# Patient Record
Sex: Female | Born: 1970 | Race: Black or African American | Hispanic: No | Marital: Married | Smoking: Never smoker
Health system: Southern US, Community
[De-identification: ages and names within clinical notes are randomized; demographics above are authoritative.]

## PROBLEM LIST (undated history)

## (undated) DIAGNOSIS — I10 Essential (primary) hypertension: Secondary | ICD-10-CM

---

## 1998-02-28 ENCOUNTER — Ambulatory Visit (HOSPITAL_COMMUNITY): Admission: RE | Admit: 1998-02-28 | Discharge: 1998-02-28 | Payer: Self-pay | Admitting: Obstetrics & Gynecology

## 1998-06-21 ENCOUNTER — Inpatient Hospital Stay: Admission: AD | Admit: 1998-06-21 | Discharge: 1998-06-21 | Payer: Self-pay | Admitting: Obstetrics & Gynecology

## 1998-06-24 ENCOUNTER — Ambulatory Visit (HOSPITAL_COMMUNITY): Admission: RE | Admit: 1998-06-24 | Discharge: 1998-06-24 | Payer: Self-pay | Admitting: Obstetrics and Gynecology

## 1999-04-30 ENCOUNTER — Other Ambulatory Visit: Admission: RE | Admit: 1999-04-30 | Discharge: 1999-04-30 | Payer: Self-pay | Admitting: Obstetrics and Gynecology

## 1999-11-29 ENCOUNTER — Inpatient Hospital Stay (HOSPITAL_COMMUNITY): Admission: AD | Admit: 1999-11-29 | Discharge: 1999-12-01 | Payer: Self-pay | Admitting: *Deleted

## 2000-01-14 ENCOUNTER — Other Ambulatory Visit: Admission: RE | Admit: 2000-01-14 | Discharge: 2000-01-14 | Payer: Self-pay | Admitting: *Deleted

## 2001-08-25 ENCOUNTER — Other Ambulatory Visit: Admission: RE | Admit: 2001-08-25 | Discharge: 2001-08-25 | Payer: Self-pay | Admitting: Obstetrics and Gynecology

## 2007-07-21 ENCOUNTER — Encounter: Admission: RE | Admit: 2007-07-21 | Discharge: 2007-07-21 | Payer: Self-pay | Admitting: Obstetrics and Gynecology

## 2007-07-27 ENCOUNTER — Encounter: Admission: RE | Admit: 2007-07-27 | Discharge: 2007-07-27 | Payer: Self-pay | Admitting: Obstetrics and Gynecology

## 2007-12-02 ENCOUNTER — Inpatient Hospital Stay (HOSPITAL_COMMUNITY): Admission: AD | Admit: 2007-12-02 | Discharge: 2007-12-13 | Payer: Self-pay | Admitting: Neurosurgery

## 2007-12-21 ENCOUNTER — Ambulatory Visit (HOSPITAL_COMMUNITY): Admission: RE | Admit: 2007-12-21 | Discharge: 2007-12-21 | Payer: Self-pay | Admitting: Neurosurgery

## 2007-12-27 ENCOUNTER — Encounter: Payer: Self-pay | Admitting: Neurosurgery

## 2008-03-19 ENCOUNTER — Ambulatory Visit (HOSPITAL_COMMUNITY): Admission: RE | Admit: 2008-03-19 | Discharge: 2008-03-19 | Payer: Self-pay | Admitting: Interventional Radiology

## 2008-11-30 ENCOUNTER — Ambulatory Visit (HOSPITAL_COMMUNITY): Admission: RE | Admit: 2008-11-30 | Discharge: 2008-11-30 | Payer: Self-pay | Admitting: Interventional Radiology

## 2009-05-23 ENCOUNTER — Encounter: Admission: RE | Admit: 2009-05-23 | Discharge: 2009-05-23 | Payer: Self-pay | Admitting: Family Medicine

## 2009-09-14 IMAGING — MG MM SCREEN MAMMOGRAM BILATERAL
4 series · 4 of 4 positions shown · non-contrast
Comparison: none

DG SCREEN MAMMOGRAM BILATERAL
Bilateral CC and MLO view(s) were taken.

DIGITAL SCREENING MAMMOGRAM WITH CAD:
The breast tissue is extremely dense.  A possible mass is noted in the left breast.  Spot 
compression views and possibly sonography are recommended for further evaluation.  In the right 
breast, no masses or malignant type calcifications are identified.

[R CC]
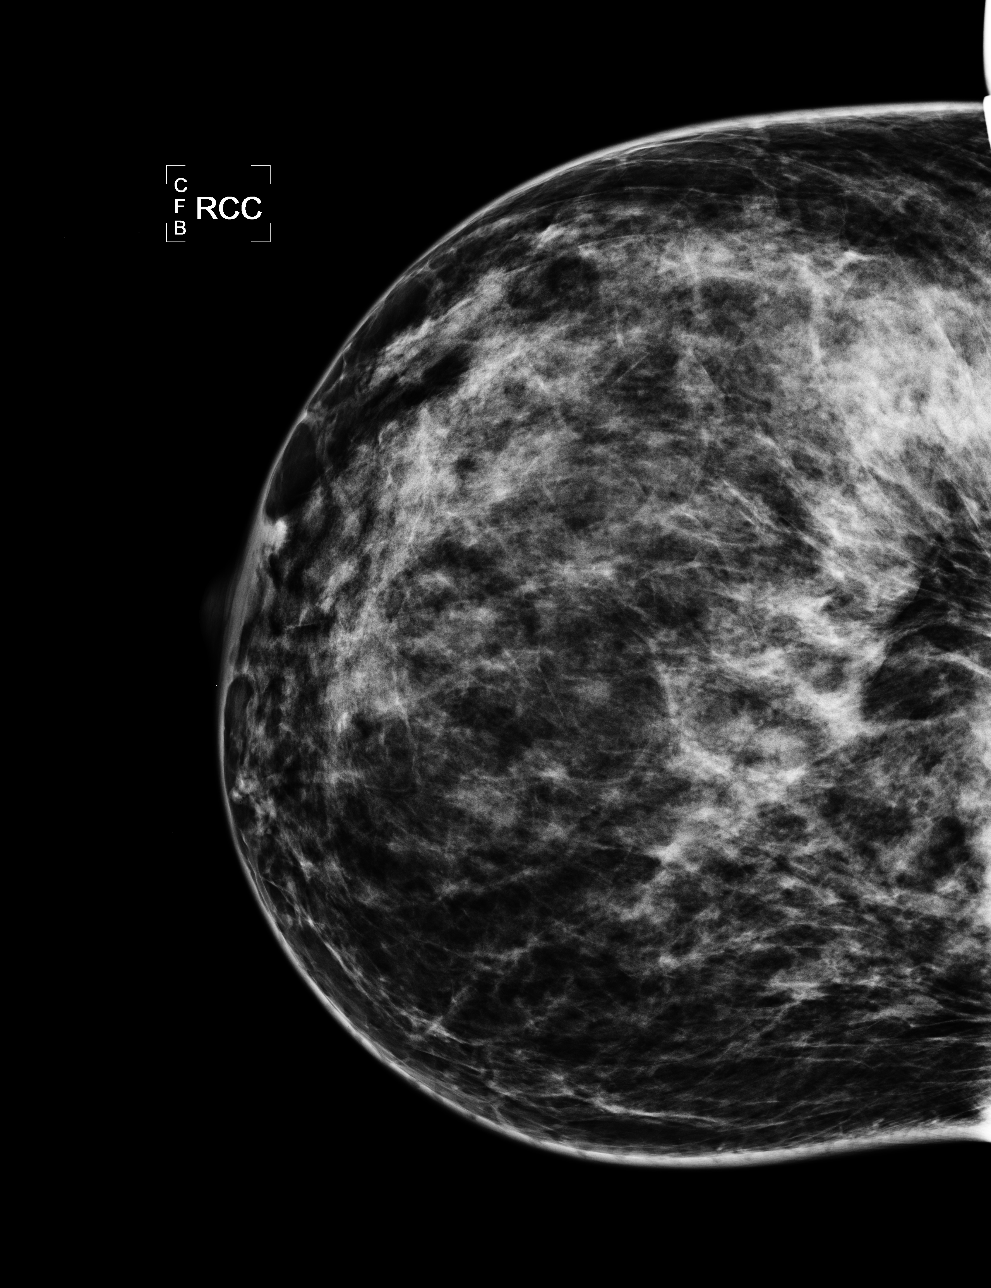

[L CC]
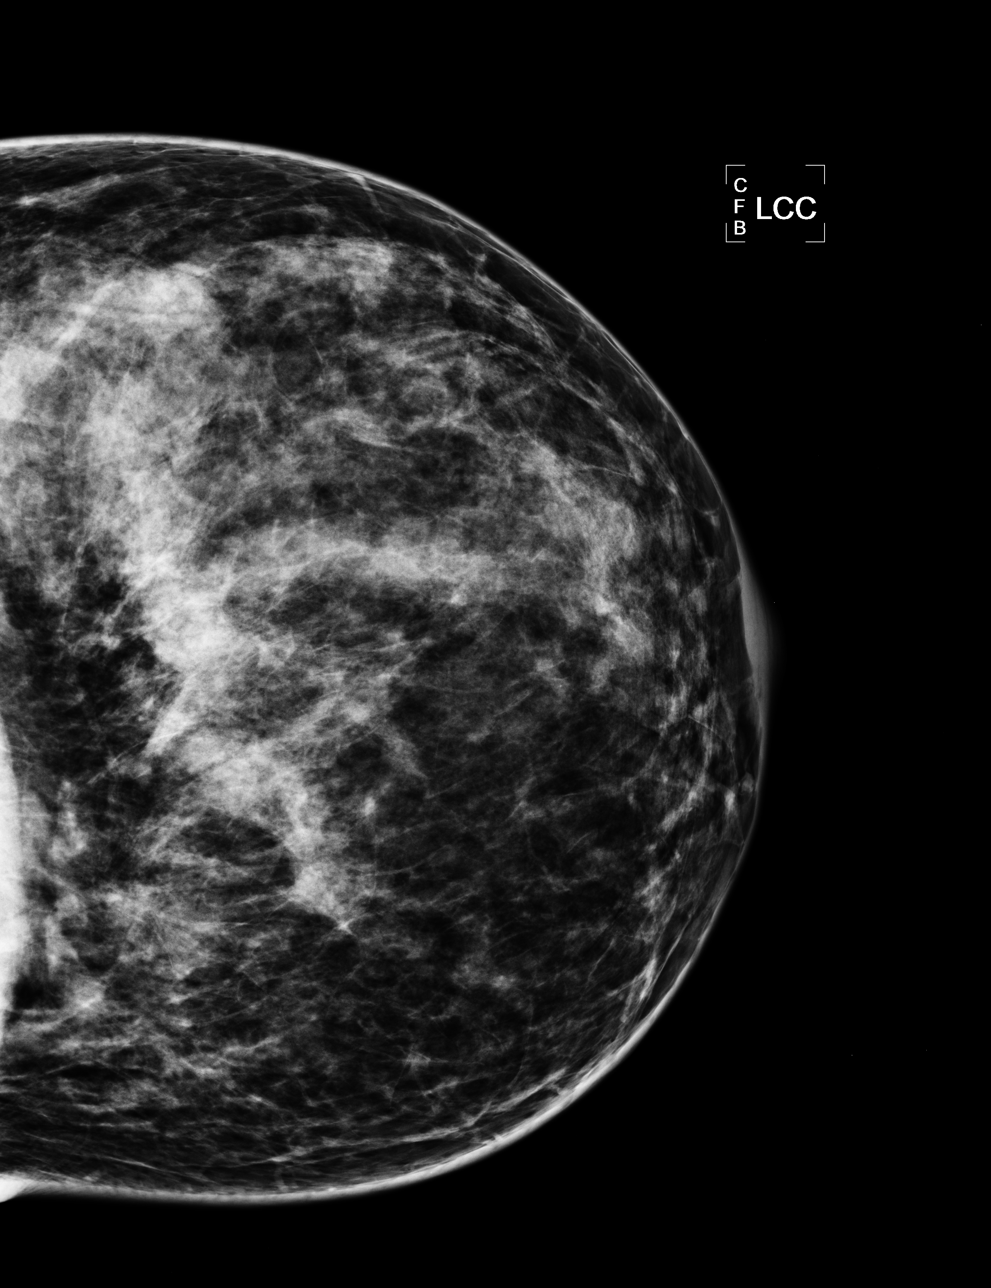

[L MLO]
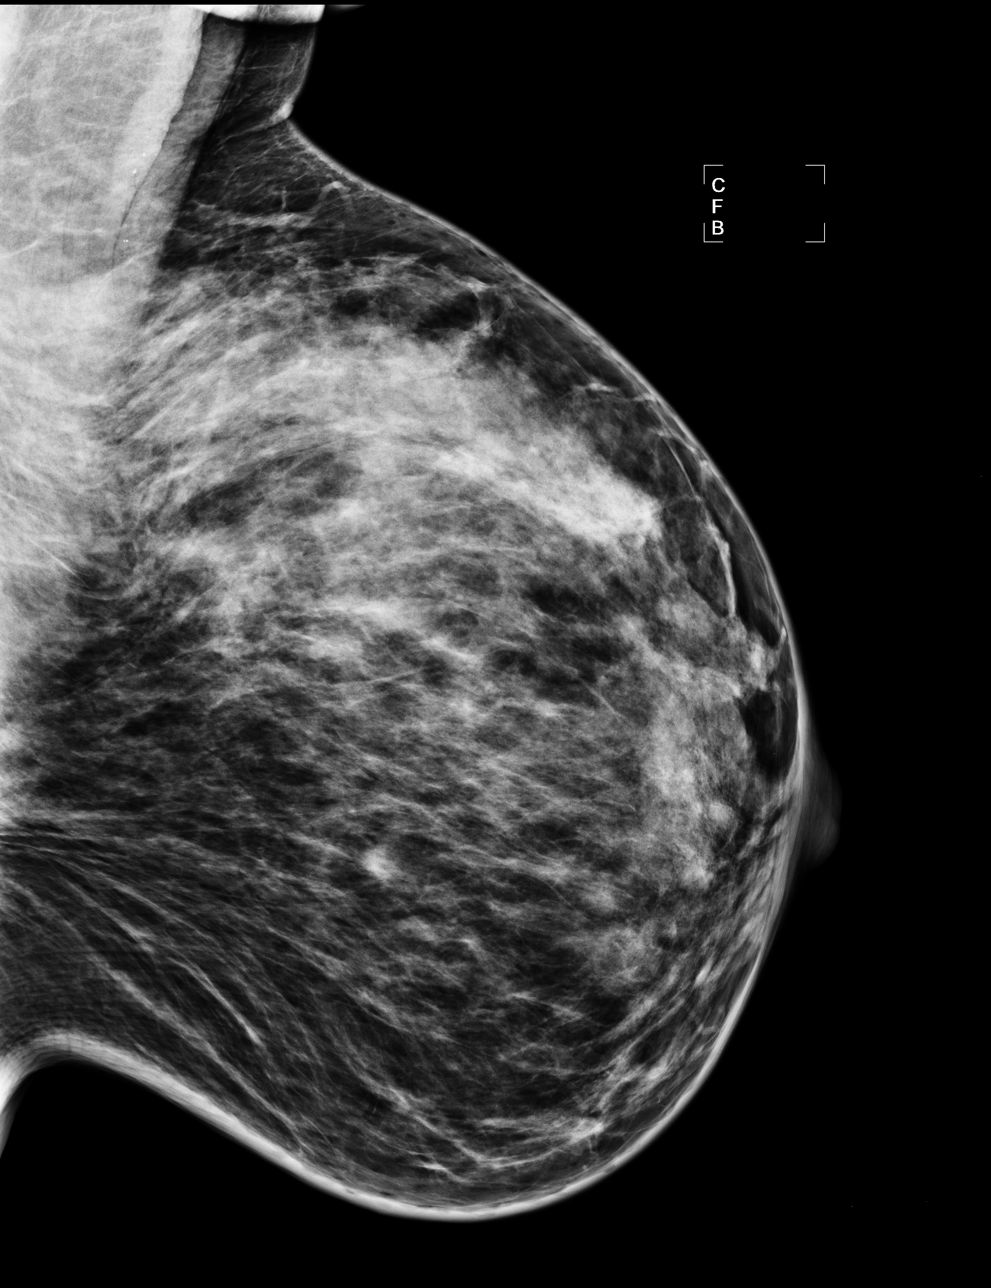

[R MLO]
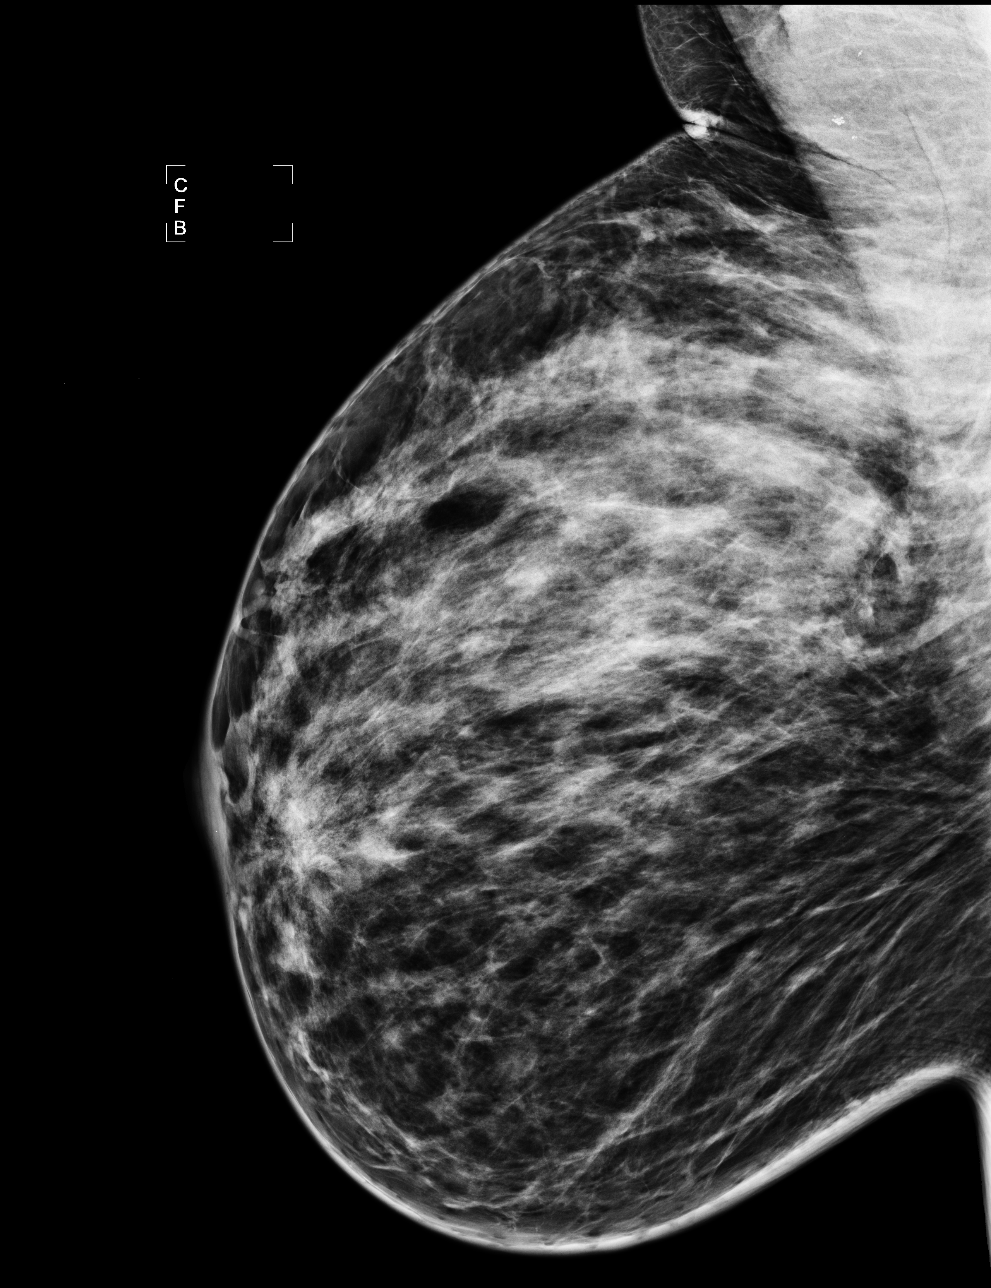

[4 of 4 positions shown; findings below may reference images not displayed]

IMPRESSION: Possible mass, left breast.  Additional evaluation is indicated.  The patient will be contacted for
additional studies and a supplementary report will follow.  No specific mammographic evidence of 
malignancy, right breast.

ASSESSMENT: Need additional imaging evaluation and/or prior mammograms for comparison - BI-RADS 0

Further imaging of the left breast.
ANALYZED BY COMPUTER AIDED DETECTION. , THIS PROCEDURE WAS A DIGITAL MAMMOGRAM.

## 2009-12-23 ENCOUNTER — Ambulatory Visit (HOSPITAL_COMMUNITY): Admission: RE | Admit: 2009-12-23 | Discharge: 2009-12-23 | Payer: Self-pay | Admitting: Interventional Radiology

## 2010-01-26 IMAGING — XA IR ANGIO/CAROTID/CERV BI
2 of 3 series · 11 of 24 positions shown · non-contrast
Comparison: MRI brain and MRA of the brain of 12/01/2004.

12/15/07 – DUPLICATE COPY for exam association in [facility] – No change from original report.
CLINICAL DATA: Severe sudden headaches with nausea. Abnormal MRI
 of brain and MRA of the brain suggestive of left posterior
 communicating artery region aneurysm and vasospasm.

 BILATERAL CAROTID ARTERIOGRAPHY and bilateral vertebral artery
 arteriograms followed by endovascular occlusion of ruptured left
 posterior communicating artery region aneurysm.

[Series 1: run · 10 of 583 slices shown (1 of 2)]
[im 28/583]
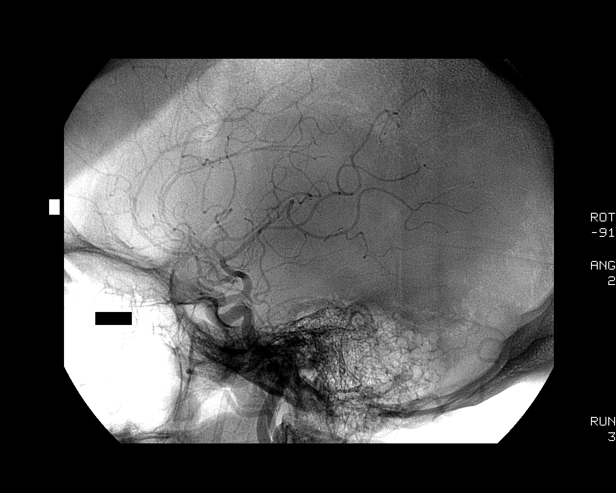
[im 84/583]
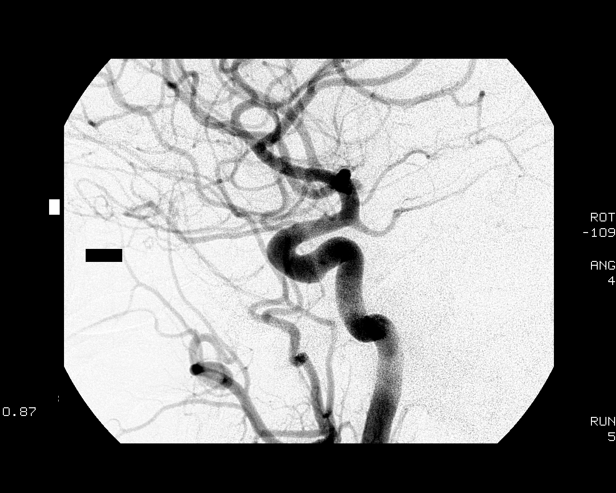
[im 139/583]
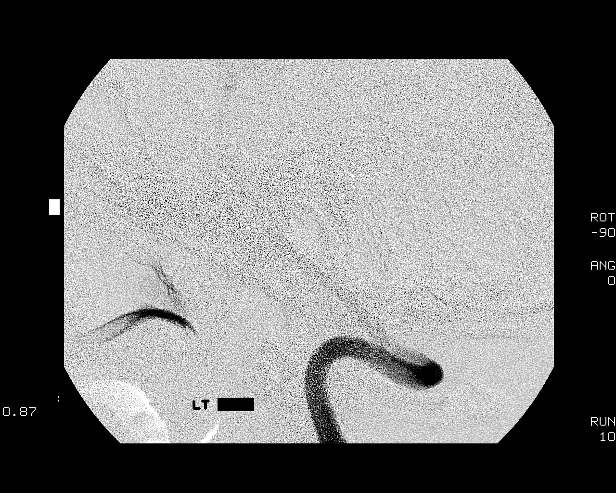
[im 195/583]
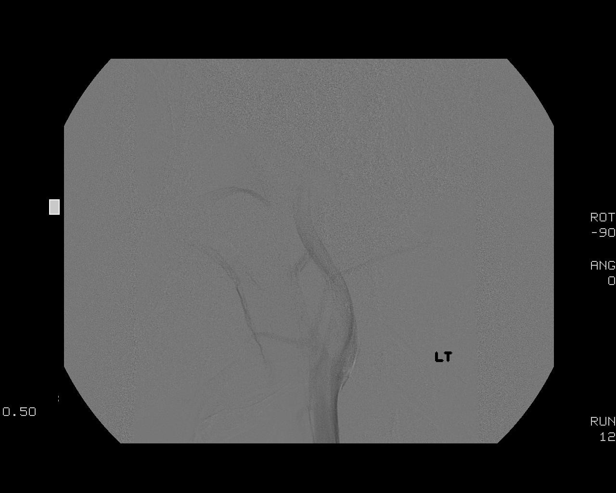
[im 250/583]
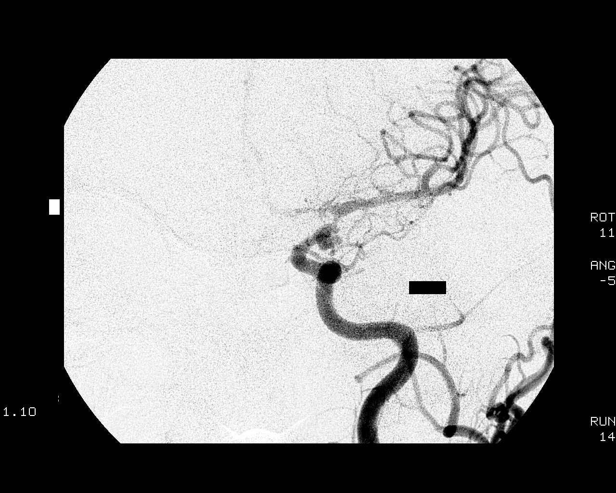
[im 333/583]
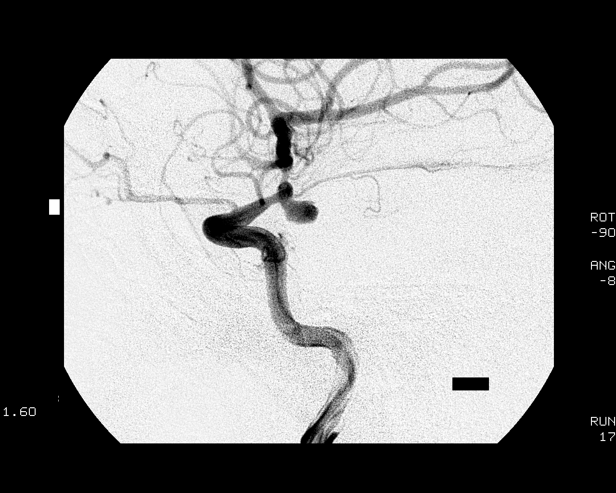
[im 389/583]
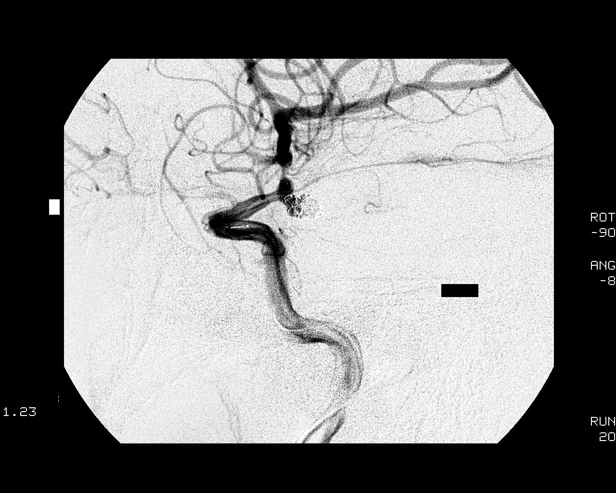
[im 444/583]
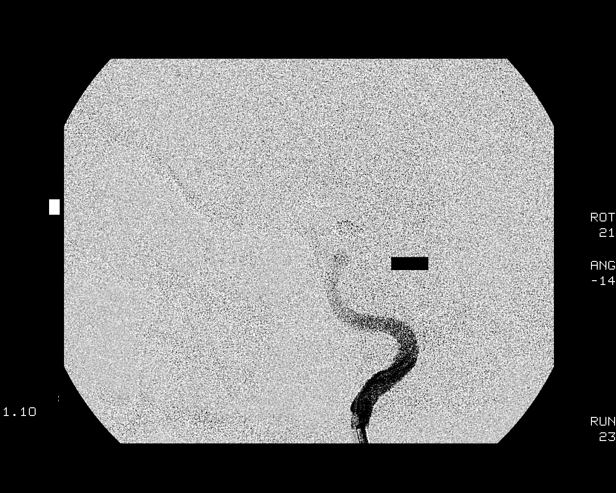
[im 499/583]
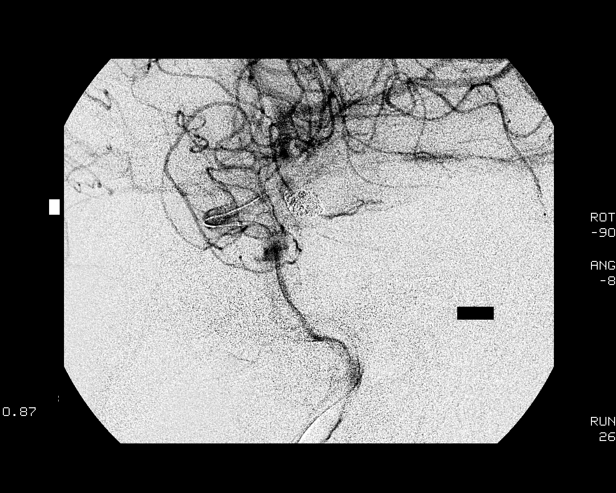
[im 555/583]
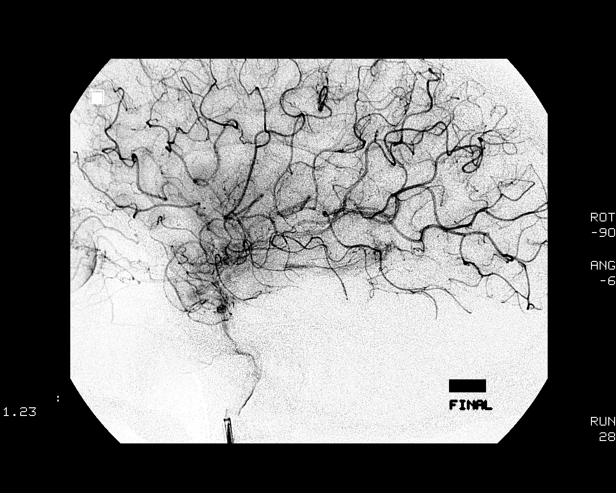

[Series 15: run · 1 of 13 slices shown (2 of 2)]
[im 1/13]
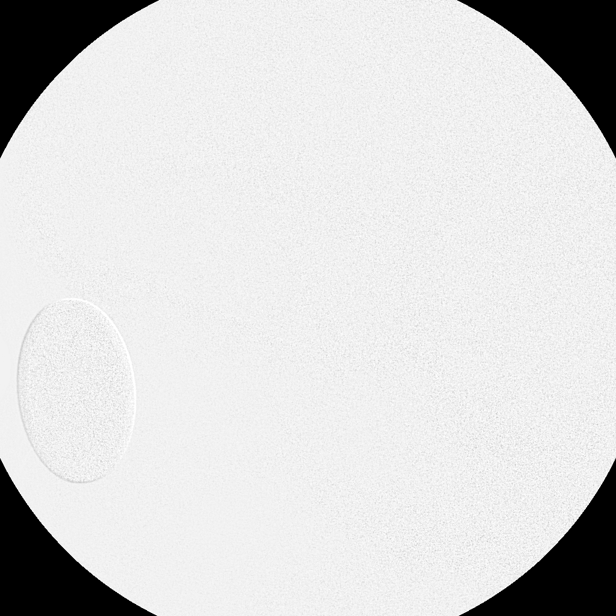

[11 of 24 positions shown; findings below may reference images not displayed]

FINDINGS: Following a full explanation of procedure along with
 potential associated complications, an informed witnessed consent
 was obtained.

 The right groin was prepped and draped in the usual sterile
 fashion. Thereafter using a modified Seldinger technique,
 transfemoral access into the right common femoral artery was
 obtained without difficulty. Over a point of 0.03 inch guide wire,
 5-French Pinnacle sheath was inserted. Through this and also over
 a 0.3 inch guide wire, a 5-French JB1 catheter was advanced through
 the aortic arch region and selectively positioned in the right
 common carotid artery at the right vertebral artery, left common
 carotid artery and left vertebral artery.

 There were no acute complications.

 The patient tolerated the procedure well.

 Medications utilized: Versed 1 mg IV. Fentanyl 25 mcg IV.

 Contrast: Omnipaque 200, approximately 40 ml.

 The right common carotid arteriogram demonstrates the right
 external carotid artery and the major branches to be normally
 opacified

 The right internal carotid artery at the bulb to the cranial skull
 base is also normal. The petrous, the cavernous and the
 supraclinoid segments are normally opacified. Transit
 opacification of the right PCOM is noted.

 The right middle and the right anterior cerebral arteries are seen
 to opacify normally into capillary and venous phases. Cross
 opacification via the ACOM of the left ACA distal to the A2 is
 noted. Also noted is normal opacification of the anterior
 communicating artery region complex from a right internal carotid
 artery injection with the left neck compression. The right
 vertebral artery origin is normal. The vessel is seen to opacify
 normally through the cranial skull base.

 There is normal opacification of the right posterior-inferior
 cerebellar artery and the right vertebrobasilar junction. The
 basilar artery, the posterior cerebral arteries, superior cerebral
 arteries and anterior inferior cerebral arteries are seen to
 opacify normally to capillary and venous phases.

 The left vertebral artery origin is normal. The vessel is seen to
 opacify normally to the cranial skull base. Normal opacification
 is noted of the left posterior -inferior cerebellar artery and the
 left vertebrobasilar junction.

 The basilar artery, the posterior cerebral arteries, superior
 cerebellar arteries and anterior inferior cerebral arteries are
 seen to opacify normally into capillary and venous phases.

 The left common carotid arteriogram demonstrates the left external
 carotid artery and its major branches to be normal.

 The left internal carotid artery at the bulb to the cranial skull
 base is also normally opacified.

 The petrous and the proximal cavernous segments of the left ACA are
 normal

 There is a tapered narrowing of the distal cavernous segment of the
 left ICA extending into the moderately severe stenosis and the
 supraclinoid left ICA. In the left PCOM region, there is saccular
 aneurysm bilobed and irregular in contour measuring approximately
 7.4 mm x 3.5 mm.

 The left middle cerebral artery is seen to opacify normally into
 capillary and venous phases.

 The left anterior cerebral artery in its A1 segment has a focal
 area of moderately severe stenosis also probably representing a
 vasospasm.
IMPRESSION: 1. Approximately 7.4 mm of 3.5 mm saccular aneurysm irregular in
 contour and bilobed arising in the left posterior communicating
 artery region aneurysm.
 2. Moderate to severe to moderate spasm involving the left ICA and
 the supraclinoid segment, and the distal cavernous segment
 respectively.

 ENDOVASCULAR OCCLUSION OF RUPTURED LEFT POSTERIOR COMMUNICATING
 ARTERY REGION ANEURYSM.

 The results of the angiogram were reviewed with Dr. Klaas the
 referring neurosurgeon. It was elected to proceed with
 endovascular treatment of the ruptured left posterior communicating
 artery region aneurysm. The risks of the procedure were discussed
 with the patient and the spouse. Risk of intra-procedure rupture,
 need for emergent neurosurgical intervention, thromboembolic stroke
 and death were reviewed.

 The patient was put under general anesthesia by The [REDACTED] following informed consent. The diagnostic JB1
 catheter in the left common carotid artery was exchanged over a
 0.035 300 cm Rosen exchange guide wire for a 6-French 65 cm
 neurovascular sheath using biplane roadmap technique and constant
 fluoroscopic guidance. Good aspiration was obtained from the hub
 of this neurovascular sheath. A gentle contrast ejection
 demonstrated no evidence of spasms, dissections or intraluminal
 filling defects

 This was then connected to continuous Heparinized-saline fusion.
 Over the Rosen exchange guide wire, a 6-French 90 cm Brite-Tip
 Envoy straight guide catheter was then advanced and positioned just
 proximal to the left common carotid artery bifurcation. The guide
 wire was removed. Good aspiration was obtained from the hub of the
 6-French guide catheter. A gentle contrast injection demonstrated
 no evidence of spasm, dissections or internal filling defects.
 Over a 0.035 inch Roadrunner guide wire, using biplane roadmap
 technique and constant fluoroscopic guidance, a 6-French 90 cm
 guide catheter was then advanced into the distal left internal
 carotid artery. The guide wire was again removed. Good aspiration
 was obtained from the hub of the 6-French guide catheter. Again, a
 gentle contrast ejection demonstrated no evidence of spasms,
 dissections or intraluminal filling defects.

 At this time, in a coaxial manner and with constant Heparinized
 saline infusion, an Excelsior TF-S1 microcatheter with two markers
 which had been steam shaved was advanced over a 0.014 inch
 Transcend soft-EX microguidewire to the distal end of the catheter.
 With the microguidewire leading with a J-tip configuration to avoid
 dissections or inducing spasm, the combination was navigated with
 torque device control into the distal cavernous segment. With the
 microguidewire leading the J-tip configuration, the aneurysm was
 entered without difficulty followed by the microcatheter. The
 microguidewire was then retrieved using constant fluoroscopic
 guidance and roadmap technique ensuring no sudden forward motion
 movements of the microcatheter tip. None was observed. Good
 aspiration was obtained from the hub of the microcatheter. A
 controlled arteriogram was performed through the 6-French guide
 catheter demonstrating safe positioning of the microcatheter with
 an aneurysm for embolization to begin.

 The first coil utilized was a 4 mm x 11.5 cm Presidio 10 Cerecyte
 coil. This was advanced using biplane roadmap technique and
 constant fluoroscopic guidance in a coaxial manner with constant
 Heparinized saline infusion to the distal end of the microcatheter
 within the aneurysm. The aneurysm was then entered with the coil
 within a nice basket being formed. A control arteriogram through
 the 6-French guide catheter at this time demonstrated flash
 extravasation from the fundus of the aneurysm projecting
 posteriorly. No hemodynamic changes such as sudden arising blood
 pressure or change in heart rate was observed. The patient's
 neurological status was also stable. This coil was then detached.
 This then followed by the placement of a 2 mm x 4 cm HydroCoil 10,
 a 2 mm x 3 cm Hydrasoft HydroCoil 10, a 2 mm x 3 cm Hydrasoft
 HydroCoil 10, a 2 mm x 3 cm Hydrocoil 10 subsequently by a 2 mm x
 2.5 cm MicruSphere Cerecyte coil, and finally a 2 mm x 2 cm height
 HydroCoil 10. After the placement of the second coil, no
 extravasation was noted.

 Each coil was advanced into the aneurysm using biplane roadmap
 technique and constant fluoroscopic guidance. Prior to detachment,
 a gentle contrast injection via the 6-French guide catheter was
 made to ensure safe positioning of the core mass. At the end of
 the final coil, there is near complete obliteration of the aneurysm
 with no change in the intracranial circulation. There was a
 minimal 1 mm neck remnant noted with stasis and slow flow.

 Using biplane roadmap technique and constant fluoroscopic guidance,
 the microcatheter was retrieved from the coil mass ensuring no
 movement of the coil mass at the neck. None was observed. This
 was then retrieved proximally. Control arteriogram was then
 performed through the 6-French guide catheter which continues to
 demonstrate near-complete obliteration with no change in the neck
 remnant or stability of a coil mass. Contrast stasis was noted in
 the region of the neck of the aneurysm.

 The microcatheter including the 6-French guide catheter and 6-
 French neurovascular sheath were then retrieved and removed into
 the abdominal aorta and exchanged over J-tip guide wire for a 7-
 French neurovascular sheath. This was then connected to continuous
 Heparinized saline infusion.

 The patient was then transferred to the CT scan suite for post-
 procedural CT scan of the brain.
IMPRESSION: 1. Status post endovascular obliteration of ruptured left PCOM
 region aneurysm with less than 1 mm neck remnant as described.

## 2010-10-14 LAB — CBC
HCT: 35 % — ABNORMAL LOW (ref 36.0–46.0)
Hemoglobin: 12.1 g/dL (ref 12.0–15.0)
WBC: 3 10*3/uL — ABNORMAL LOW (ref 4.0–10.5)

## 2010-10-14 LAB — BASIC METABOLIC PANEL
Calcium: 8.4 mg/dL (ref 8.4–10.5)
GFR calc Af Amer: >60 mL/min (ref >60)
GFR calc non Af Amer: >60 mL/min (ref >60)
Potassium: 3.7 mEq/L (ref 3.5–5.1)
Sodium: 136 mEq/L (ref 135–145)

## 2010-10-14 LAB — PROTIME-INR
INR: 1.1 (ref 0.00–1.49)
Prothrombin Time: 14 seconds (ref 11.6–15.2)

## 2010-10-14 LAB — APTT: aPTT: 31 seconds (ref 24–37)

## 2010-11-18 NOTE — Consult Note (Signed)
Lisa Jordan, Lisa Jordan             ACCOUNT NO.:  [id number]   MEDICAL RECORD NO.:  [id number]          PATIENT TYPE:  OUT   LOCATION:  XRAY                         FACILITY:  [facility]   PHYSICIAN:  Sanjeev K. Deveshwar, M.D.DATE OF BIRTH:  June 10, 1971   DATE OF CONSULTATION:  12/27/2007  DATE OF DISCHARGE:                                 CONSULTATION   CHIEF COMPLAINT:  Cerebral aneurysm.   HISTORY OF PRESENT ILLNESS:  This is a very pleasant 40 year old female  who was admitted in [facility] on Dec 02, 2007, by Dr. 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  The patient was taken to the interventional lab  by Dr. Corliss Skains shortly after admission and underwent coiling of a 7.4  mm x 3.5 mm left posterior communicating artery aneurysm.  This was  followed by prolonged hospital stay in the Neuro Intensive Care Unit.  Overall, the patient did very well considering the circumstances.  She  returns today accompanied by her husband, to be seen in followup.   The patient recently saw Dr. Wynetta Emery last Tuesday.  She had been  experiencing some headaches since discharge.  Transcranial Doppler's  were performed, which according to the patient's husband Dr. Wynetta Emery,  interpreted as having some limited, but ongoing vasospasm.  The  patient's headaches appear to be improving at this time, although she  has suffered from some mild fatigue.  She is not due to return to work  until August.  She has been driving some short distances with Dr. Lonie Peak  permission.  She presents today accompanied by her husband, to be seen  in followup by Dr. Corliss Skains.   PAST MEDICAL HISTORY:  The patient has been very healthy.  She has had  some mild anemia.  She was seen in consultation while in the hospital  for some heartburn and indigestion and treated with Protonix, this has  since cleared up.   SURGICAL HISTORY:  The patient has had no major  surgeries.   ALLERGIES:  1. VICODIN causes her throat to itch and chest pain.  2. She is intolerant to tramadol, which caused her to become anxious.   CURRENT MEDICATIONS:  Include Darvocet p.r.n. for headaches.   SOCIAL HISTORY:  The patient is married.  They have 2 children.  They  live in [place].  The patient has never been a smoker.  She does not  use alcohol to any significant degree.  She works as an Programmer, systems.   FAMILY HISTORY:  Her mother is alive and well at age 72.  Her father is  alive at age 47.  She has problems with pancreatitis.  The patient has a  strong history of cerebral aneurysms in her family including her  maternal great-grandmother, her grandmother's brother, and his daughter  as well as an uncle; all of whom has had cerebral aneurysms.   IMPRESSION AND PLAN:  As noted, the patient returns today, to be seen in  followup by Dr. Corliss Skains after undergoing coiling of a ruptured left  posterior communicating artery region aneurysm measuring 7.4 mm  x 3.5 mm  performed by Dr. Corliss Skains, on Dec 02, 2007.  Apparently, the patient  had been having problems with headaches for quite some time prior to the  leaking of the aneurysm, which led to her admission on Dec 02, 2007.  Dr. Corliss Skains believes that this was the second time that the aneurysm  may have leaked.   Dr. Corliss Skains did review the results from the angiogram with the patient  and her husband.  They were shown the before and after images of the  aneurysm and the coiling.  They had multiple questions and talked at  great length.  They also had some questions regarding their children,  whether or not, they should be screened for aneurysms.  Dr. Corliss Skains  did not feel that this was appropriate at their age and in view of the  fact that they were asymptomatic.   The plan at this time will be to repeat a cerebral angiogram in  approximately 3 months and then 1 year from the initial date of the  coiling.   Further recommendations will be made based on the results of  these subsequent angiograms.   Greater than 40 minutes was spent on this consult.      Delton See, P.A.    ______________________________  Grandville Silos. Corliss Skains, M.D.    DR/MEDQ  D:  12/27/2007  T:  12/28/2007  Job:  [id number]   cc:   Donalee Citrin, M.D.  Emeterio Reeve, MD

## 2010-11-18 NOTE — Consult Note (Signed)
NAMEMARG, MACMASTER NO.:  [id number]   MEDICAL RECORD NO.:  [id number]          PATIENT TYPE:  INP   LOCATION:  3114                         FACILITY:  [facility]   PHYSICIAN:  Graylin Shiver, M.D.   DATE OF BIRTH:  1970/08/07   DATE OF CONSULTATION:  DATE OF DISCHARGE:                                 CONSULTATION   REASON FOR CONSULTATION:  The patient is a 40 year old black female who  was admitted to the hospital last week with a severe headache.  She was  found to have a brain aneurysm with subsequent therapeutic intervention.  With coiling, there was some surrounding blood in the brain aneurysm  from my review of the records.  The patient is doing well from that  standpoint and progressing nicely.   The reason that we were asked to consult on her from a GI standpoint is  because yesterday she developed indigestion and heartburn.  The patient  feels a little better today.  She is on Protonix 40 mg p.o. b.i.d.  She  states that yesterday while eating, she started getting quite severe  heartburn and indigestion.  She does not give a history of peptic ulcer  disease or GERD.  She denies dysphagia.  She denies vomiting,  hematemesis, melena, or hematochezia.   PAST HISTORY:  Allergies HYDROCODONE.   PAST SURGICAL HISTORY:  None.   MEDICAL PROBLEMS:  None.   SOCIAL HISTORY:  Does not smoke or drink alcohol.   PHYSICAL EXAMINATION:  She is in no distress.  She is alert and  oriented.  She is nonicteric.  Heart regular rhythm.  No murmurs.  Lungs  are clear.  Abdomen is soft, nontender, no hepatosplenomegaly.   Review of labs show her stool was heme positive; however, she states she  is on her menstrual period.  She has not seen any blood in the stool or  melena.   Hemoglobin is 9.2.   IMPRESSION:  1. New onset of indigestion and heartburn.  This could be secondary to      stress gastritis or acute reflux.  2. Anemia.  3. Heme-positive stool; However, the  patient is on her menstrual      period.   RECOMMENDATIONS:  I would recommend that we treat this patient  conservatively at this time with Protonix 40 mg p.o. b.i.d. and observe  her symptoms.  She does not appear to be suffering much at this time  from the indigestion  or heartburn.  Hopefully, we can control her symptoms with double-dose  Protonix for now and then switch to once a day.  In regards to the  anemia, I will check serum iron studies, B12 and a folate level as far  as the heme-positive stool.  This may be a false positive in light of  being on her menstrual period.           ______________________________  Graylin Shiver, M.D.     SFG/MEDQ  D:  12/10/2007  T:  12/10/2007  Job:  [id number]   cc:   Donalee Citrin, M.D.

## 2010-11-21 NOTE — Discharge Summary (Signed)
Lisa Jordan, Lisa Jordan             ACCOUNT NO.:  [id number]   MEDICAL RECORD NO.:  [id number]          PATIENT TYPE:  INP   LOCATION:  3114                         FACILITY:  [facility]   PHYSICIAN:  Donalee Citrin, M.D.        DATE OF BIRTH:  May 10, 1971   DATE OF ADMISSION:  12/02/2007  DATE OF DISCHARGE:  12/13/2007                               DISCHARGE SUMMARY   ADMITTING DIAGNOSES:  1. Subarachnoid hemorrhage.  2. Ruptured posterior communicating artery aneurysm.  3. Vasospasm.   DISCHARGE DIAGNOSES:  1. Subarachnoid hemorrhage.  2. Ruptured posterior communicating artery aneurysm.  3. Vasospasm.   PROCEDURE DURING THIS HOSPITALIZATION:  Coiling of a posterior  communicating artery aneurysm.   HISTORY OF PRESENT ILLNESS:  The patient is a 40 year old female who  presented with a month history of intermittent headaches, two sentinel  headaches, and the most recent headache was 9 days ago.  Admitting MRI  scan and MRA scan showed PCOM aneurysm.  Subsequent arteriogram showed  severe vasospasm and a PCOM aneurysm.  The patient underwent coiling and  postoperatively the patient went to an ICU.  In the ICU, the patient  recovered well, had some episodic blurred vision that resolved.  She did  have a postop CT that showed a slight increase in her ventricle size and  some contrast material with some extravasation that she had during the  coiling procedure.  She had a partial right fourth nerve palsy.  This  did resolve in the next 24 hours.  The patient is now awoken and was  observed in the ICU, maintained on hypervolemic and hemodilution therapy  to treat her vasospasm, was not ever started on pressors which was not  needed.  The patient was followed with serial transcranial Dopplers  which did show worsening of her vasospasm before it improved, however,  this did remain stable and clinically she remained intact except for  headache.  She did had some nausea and vomiting and some  presumptive  gastritis symptoms.  The patient was seen by Gastroenterology.  Some  medication changes were made to control her gastritis and this did help.  She subsequently resolved from this and was ambulating and voiding with  minimal headaches.  Subsequent CT showed resolution of ventricular size  with no hydrocephalus.  The patient was able to be discharged home after  transcranial Dopplers shown to be improving, and she was discharged with  followup in 1 week for a repeat transcranial Doppler and evaluation and  she was discharged on a couple days of nimodipine as well as pain  medication and at the time of discharge, she was neurologically intact.           ______________________________  Donalee Citrin, M.D.     GC/MEDQ  D:  01/18/2008  T:  01/18/2008  Job:  [id number]

## 2011-01-20 ENCOUNTER — Other Ambulatory Visit (HOSPITAL_COMMUNITY)
Admission: RE | Admit: 2011-01-20 | Discharge: 2011-01-20 | Disposition: A | Discharge status: Home / Self Care | Payer: BC Managed Care – PPO | Source: Ambulatory Visit | Attending: Obstetrics and Gynecology | Admitting: Obstetrics and Gynecology

## 2011-01-20 ENCOUNTER — Other Ambulatory Visit: Payer: Self-pay | Admitting: Obstetrics and Gynecology

## 2011-01-20 DIAGNOSIS — Z1231 Encounter for screening mammogram for malignant neoplasm of breast: Secondary | ICD-10-CM

## 2011-01-20 DIAGNOSIS — Z01419 Encounter for gynecological examination (general) (routine) without abnormal findings: Secondary | ICD-10-CM | POA: Insufficient documentation

## 2011-01-20 DIAGNOSIS — Z1159 Encounter for screening for other viral diseases: Secondary | ICD-10-CM | POA: Insufficient documentation

## 2011-02-10 ENCOUNTER — Ambulatory Visit
Admission: RE | Admit: 2011-02-10 | Discharge: 2011-02-10 | Disposition: A | Discharge status: Home / Self Care | Payer: BC Managed Care – PPO | Source: Ambulatory Visit | Attending: Obstetrics and Gynecology | Admitting: Obstetrics and Gynecology

## 2011-02-10 DIAGNOSIS — Z1231 Encounter for screening mammogram for malignant neoplasm of breast: Secondary | ICD-10-CM

## 2011-04-01 LAB — BASIC METABOLIC PANEL
BUN: 2 — ABNORMAL LOW
BUN: 9
Calcium: 9.3
Chloride: 110
GFR calc Af Amer: >60
GFR calc non Af Amer: >60
GFR calc non Af Amer: >60
Glucose, Bld: 98
Potassium: 3.4 — ABNORMAL LOW
Potassium: 3.8
Sodium: 138
Sodium: 140

## 2011-04-01 LAB — TYPE AND SCREEN
ABO/RH(D): A POS
Antibody Screen: NEGATIVE

## 2011-04-01 LAB — APTT: aPTT: 29

## 2011-04-01 LAB — CBC
HCT: 34.4 — ABNORMAL LOW
Hemoglobin: 11.8 — ABNORMAL LOW
MCHC: 35.3
MCV: 97.6
Platelets: 219
Platelets: 284
RBC: 3.17 — ABNORMAL LOW
RDW: 11.8
WBC: 7.5

## 2011-04-01 LAB — POCT I-STAT 7, (LYTES, BLD GAS, ICA,H+H)
Acid-Base Excess: 2
Bicarbonate: 25.5 — ABNORMAL HIGH
HCT: 30 — ABNORMAL LOW
O2 Saturation: 100
Sodium: 142
TCO2: 26
pO2, Arterial: 552 — ABNORMAL HIGH

## 2011-04-01 LAB — DIFFERENTIAL
Basophils Absolute: 0
Eosinophils Relative: 0
Lymphocytes Relative: 18
Lymphs Abs: 1.4
Neutro Abs: 5.8

## 2011-04-01 LAB — POCT I-STAT GLUCOSE
Glucose, Bld: 92
Operator id: 117072

## 2011-04-01 LAB — PROTIME-INR
INR: 1
Prothrombin Time: 12.9

## 2011-04-01 LAB — HEPARIN LEVEL (UNFRACTIONATED): Heparin Unfractionated: <0.1 — ABNORMAL LOW

## 2011-04-02 LAB — CBC
HCT: 25.8 — ABNORMAL LOW
HCT: 26.3 — ABNORMAL LOW
HCT: 33.3 — ABNORMAL LOW
Hemoglobin: 11.7 — ABNORMAL LOW
Hemoglobin: 11.8 — ABNORMAL LOW
Hemoglobin: 9.2 — ABNORMAL LOW
Hemoglobin: 9.2 — ABNORMAL LOW
MCHC: 34
MCHC: 35.1
MCV: 97.6
MCV: 99.3
Platelets: 200
Platelets: 217
Platelets: 307
Platelets: 307
RBC: 2.64 — ABNORMAL LOW
RBC: 2.79 — ABNORMAL LOW
RDW: 12.4
RDW: 12.6
RDW: 12.8
WBC: 6.1
WBC: 6.7
WBC: 9

## 2011-04-02 LAB — URINE MICROSCOPIC-ADD ON

## 2011-04-02 LAB — BASIC METABOLIC PANEL
BUN: 5 — ABNORMAL LOW
BUN: 8
BUN: 9
CO2: 22
CO2: 24
Calcium: 8.2 — ABNORMAL LOW
Calcium: 8.3 — ABNORMAL LOW
Calcium: 8.4
Chloride: 105
Chloride: 112
Creatinine, Ser: 0.52
Creatinine, Ser: 0.62
Creatinine, Ser: 0.63
GFR calc Af Amer: >60
GFR calc Af Amer: >60
GFR calc non Af Amer: >60
GFR calc non Af Amer: >60
GFR calc non Af Amer: >60
Glucose, Bld: 136 — ABNORMAL HIGH
Glucose, Bld: 98
Potassium: 3.7
Potassium: 4.1
Sodium: 139
Sodium: 139

## 2011-04-02 LAB — DIFFERENTIAL
Basophils Absolute: 0
Basophils Relative: 1
Lymphocytes Relative: 18
Monocytes Absolute: 0.4
Neutro Abs: 3.9

## 2011-04-02 LAB — URINE CULTURE: Colony Count: >=100000

## 2011-04-02 LAB — FOLATE RBC: RBC Folate: 548

## 2011-04-02 LAB — URINALYSIS, ROUTINE W REFLEX MICROSCOPIC
Bilirubin Urine: NEGATIVE
Glucose, UA: NEGATIVE
Ketones, ur: NEGATIVE
Protein, ur: NEGATIVE

## 2011-04-02 LAB — IRON AND TIBC
Iron: 22 — ABNORMAL LOW
Saturation Ratios: 12 — ABNORMAL LOW
TIBC: 190 — ABNORMAL LOW
UIBC: 168

## 2011-04-02 LAB — VITAMIN B12: Vitamin B-12: 265 (ref 211–911)

## 2011-04-08 LAB — BASIC METABOLIC PANEL
BUN: 12
Chloride: 106
GFR calc non Af Amer: >60
Glucose, Bld: 90
Potassium: 3.9
Sodium: 138

## 2011-04-08 LAB — CBC
HCT: 35 — ABNORMAL LOW
Hemoglobin: 12
MCV: 97.8
WBC: 3 — ABNORMAL LOW

## 2012-01-14 ENCOUNTER — Other Ambulatory Visit: Payer: Self-pay | Admitting: Family Medicine

## 2012-01-14 DIAGNOSIS — Z1231 Encounter for screening mammogram for malignant neoplasm of breast: Secondary | ICD-10-CM

## 2012-02-15 ENCOUNTER — Ambulatory Visit
Admission: RE | Admit: 2012-02-15 | Discharge: 2012-02-15 | Disposition: A | Discharge status: Home / Self Care | Payer: BC Managed Care – PPO | Source: Ambulatory Visit | Attending: Family Medicine | Admitting: Family Medicine

## 2012-02-15 DIAGNOSIS — Z1231 Encounter for screening mammogram for malignant neoplasm of breast: Secondary | ICD-10-CM

## 2013-01-19 ENCOUNTER — Other Ambulatory Visit: Payer: Self-pay

## 2013-01-19 DIAGNOSIS — Z1231 Encounter for screening mammogram for malignant neoplasm of breast: Secondary | ICD-10-CM

## 2013-02-15 ENCOUNTER — Ambulatory Visit
Admission: RE | Admit: 2013-02-15 | Discharge: 2013-02-15 | Disposition: A | Discharge status: Home / Self Care | Payer: BC Managed Care – PPO | Source: Ambulatory Visit

## 2013-02-15 DIAGNOSIS — Z1231 Encounter for screening mammogram for malignant neoplasm of breast: Secondary | ICD-10-CM

## 2014-03-20 ENCOUNTER — Other Ambulatory Visit: Payer: Self-pay

## 2014-03-20 ENCOUNTER — Other Ambulatory Visit: Payer: Self-pay | Admitting: *Deleted

## 2014-03-20 DIAGNOSIS — N63 Unspecified lump in unspecified breast: Secondary | ICD-10-CM

## 2014-03-20 DIAGNOSIS — Z1231 Encounter for screening mammogram for malignant neoplasm of breast: Secondary | ICD-10-CM

## 2014-03-20 DIAGNOSIS — Z1211 Encounter for screening for malignant neoplasm of colon: Secondary | ICD-10-CM

## 2014-03-27 ENCOUNTER — Ambulatory Visit
Admission: RE | Admit: 2014-03-27 | Discharge: 2014-03-27 | Disposition: A | Discharge status: Home / Self Care | Payer: BC Managed Care – PPO | Source: Ambulatory Visit

## 2014-03-27 DIAGNOSIS — Z1231 Encounter for screening mammogram for malignant neoplasm of breast: Secondary | ICD-10-CM

## 2015-04-15 ENCOUNTER — Other Ambulatory Visit: Payer: Self-pay

## 2015-04-15 DIAGNOSIS — Z1231 Encounter for screening mammogram for malignant neoplasm of breast: Secondary | ICD-10-CM

## 2015-04-16 ENCOUNTER — Ambulatory Visit
Admission: RE | Admit: 2015-04-16 | Discharge: 2015-04-16 | Disposition: A | Discharge status: Home / Self Care | Payer: BC Managed Care – PPO | Source: Ambulatory Visit

## 2015-04-16 DIAGNOSIS — Z1231 Encounter for screening mammogram for malignant neoplasm of breast: Secondary | ICD-10-CM

## 2015-04-18 ENCOUNTER — Other Ambulatory Visit: Payer: Self-pay | Admitting: Family Medicine

## 2015-04-18 DIAGNOSIS — R928 Other abnormal and inconclusive findings on diagnostic imaging of breast: Secondary | ICD-10-CM

## 2015-04-25 ENCOUNTER — Ambulatory Visit
Admission: RE | Admit: 2015-04-25 | Discharge: 2015-04-25 | Disposition: A | Discharge status: Home / Self Care | Payer: BC Managed Care – PPO | Source: Ambulatory Visit | Attending: Family Medicine | Admitting: Family Medicine

## 2015-04-25 DIAGNOSIS — R928 Other abnormal and inconclusive findings on diagnostic imaging of breast: Secondary | ICD-10-CM

## 2016-04-21 ENCOUNTER — Other Ambulatory Visit: Payer: Self-pay | Admitting: Family Medicine

## 2016-04-21 DIAGNOSIS — Z1231 Encounter for screening mammogram for malignant neoplasm of breast: Secondary | ICD-10-CM

## 2016-05-04 ENCOUNTER — Ambulatory Visit: Payer: BC Managed Care – PPO

## 2016-05-27 ENCOUNTER — Other Ambulatory Visit: Payer: Self-pay | Admitting: Obstetrics and Gynecology

## 2016-05-27 ENCOUNTER — Ambulatory Visit
Admission: RE | Admit: 2016-05-27 | Discharge: 2016-05-27 | Disposition: A | Discharge status: Home / Self Care | Payer: BC Managed Care – PPO | Source: Ambulatory Visit | Attending: Family Medicine | Admitting: Family Medicine

## 2016-05-27 ENCOUNTER — Other Ambulatory Visit (HOSPITAL_COMMUNITY)
Admission: RE | Admit: 2016-05-27 | Discharge: 2016-05-27 | Disposition: A | Discharge status: Home / Self Care | Payer: BC Managed Care – PPO | Source: Ambulatory Visit | Attending: Obstetrics and Gynecology | Admitting: Obstetrics and Gynecology

## 2016-05-27 DIAGNOSIS — Z1231 Encounter for screening mammogram for malignant neoplasm of breast: Secondary | ICD-10-CM

## 2016-05-27 DIAGNOSIS — Z1151 Encounter for screening for human papillomavirus (HPV): Secondary | ICD-10-CM | POA: Insufficient documentation

## 2016-05-27 DIAGNOSIS — Z01419 Encounter for gynecological examination (general) (routine) without abnormal findings: Secondary | ICD-10-CM | POA: Diagnosis present

## 2016-06-03 LAB — CYTOLOGY - PAP
Diagnosis: NEGATIVE
HPV (WINDOPATH): NOT DETECTED

## 2016-06-10 ENCOUNTER — Other Ambulatory Visit: Payer: Self-pay | Admitting: Obstetrics & Gynecology

## 2017-03-22 ENCOUNTER — Other Ambulatory Visit: Payer: Self-pay | Admitting: Obstetrics and Gynecology

## 2017-03-22 DIAGNOSIS — Z1231 Encounter for screening mammogram for malignant neoplasm of breast: Secondary | ICD-10-CM

## 2017-05-31 ENCOUNTER — Ambulatory Visit
Admission: RE | Admit: 2017-05-31 | Discharge: 2017-05-31 | Disposition: A | Discharge status: Home / Self Care | Payer: BC Managed Care – PPO | Source: Ambulatory Visit | Attending: Obstetrics and Gynecology | Admitting: Obstetrics and Gynecology

## 2017-05-31 DIAGNOSIS — Z1231 Encounter for screening mammogram for malignant neoplasm of breast: Secondary | ICD-10-CM

## 2017-06-01 ENCOUNTER — Other Ambulatory Visit: Payer: Self-pay | Admitting: Obstetrics and Gynecology

## 2017-06-01 DIAGNOSIS — R928 Other abnormal and inconclusive findings on diagnostic imaging of breast: Secondary | ICD-10-CM

## 2017-06-07 ENCOUNTER — Ambulatory Visit
Admission: RE | Admit: 2017-06-07 | Discharge: 2017-06-07 | Disposition: A | Discharge status: Home / Self Care | Payer: BC Managed Care – PPO | Source: Ambulatory Visit | Attending: Obstetrics and Gynecology | Admitting: Obstetrics and Gynecology

## 2017-06-07 DIAGNOSIS — R928 Other abnormal and inconclusive findings on diagnostic imaging of breast: Secondary | ICD-10-CM

## 2018-06-22 ENCOUNTER — Other Ambulatory Visit: Payer: Self-pay | Admitting: Obstetrics and Gynecology

## 2018-06-22 DIAGNOSIS — Z1231 Encounter for screening mammogram for malignant neoplasm of breast: Secondary | ICD-10-CM

## 2018-06-27 ENCOUNTER — Ambulatory Visit
Admission: RE | Admit: 2018-06-27 | Discharge: 2018-06-27 | Disposition: A | Discharge status: Home / Self Care | Payer: BC Managed Care – PPO | Source: Ambulatory Visit | Attending: Obstetrics and Gynecology | Admitting: Obstetrics and Gynecology

## 2018-06-27 DIAGNOSIS — Z1231 Encounter for screening mammogram for malignant neoplasm of breast: Secondary | ICD-10-CM

## 2018-07-14 ENCOUNTER — Ambulatory Visit: Payer: BC Managed Care – PPO

## 2019-06-05 ENCOUNTER — Other Ambulatory Visit: Payer: Self-pay | Admitting: Obstetrics and Gynecology

## 2019-06-05 DIAGNOSIS — Z1231 Encounter for screening mammogram for malignant neoplasm of breast: Secondary | ICD-10-CM

## 2019-07-03 ENCOUNTER — Ambulatory Visit
Admission: RE | Admit: 2019-07-03 | Discharge: 2019-07-03 | Disposition: A | Discharge status: Home / Self Care | Payer: BC Managed Care – PPO | Source: Ambulatory Visit | Attending: Obstetrics and Gynecology | Admitting: Obstetrics and Gynecology

## 2019-07-03 ENCOUNTER — Other Ambulatory Visit: Payer: Self-pay

## 2019-07-03 DIAGNOSIS — Z1231 Encounter for screening mammogram for malignant neoplasm of breast: Secondary | ICD-10-CM

## 2019-07-27 ENCOUNTER — Ambulatory Visit: Payer: BC Managed Care – PPO

## 2019-09-09 ENCOUNTER — Ambulatory Visit: Payer: BC Managed Care – PPO | Attending: Internal Medicine

## 2019-09-09 DIAGNOSIS — Z23 Encounter for immunization: Secondary | ICD-10-CM | POA: Insufficient documentation

## 2019-09-09 NOTE — Progress Notes (Addendum)
   Covid-19 Vaccination Clinic  Name:  Aleeha Boline    MRN: [id number] DOB: [birth year]  09/09/2019  Ms. Offner was observed post Covid-19 immunization for 30 minutes based on pre-vaccination screening without incident. She was provided with Vaccine Information Sheet and instruction to access the V-Safe system.   Ms. Smyser was instructed to call 911 with any severe reactions post vaccine: Marland Kitchen Difficulty breathing  . Swelling of face and throat  . A fast heartbeat  . A bad rash all over body  . Dizziness and weakness   Immunizations Administered    Name Date Dose VIS Date Route   Pfizer COVID-19 Vaccine 09/09/2019  9:28 AM 0.3 mL 06/16/2019 Intramuscular   Manufacturer: [employer], [employer]   Lot: [id number]   NDC: [address]-1

## 2019-09-30 ENCOUNTER — Ambulatory Visit: Payer: BC Managed Care – PPO | Attending: Internal Medicine

## 2019-09-30 DIAGNOSIS — Z23 Encounter for immunization: Secondary | ICD-10-CM

## 2019-09-30 NOTE — Progress Notes (Signed)
   Covid-19 Vaccination Clinic  Name:  Lisa Jordan    MRN: [id number] DOB: 12/05/1970  09/30/2019  [name] was observed post Covid-19 immunization for 15 minutes without incident. She was provided with Vaccine Information Sheet and instruction to access the V-Safe system.   [name] was instructed to call 911 with any severe reactions post vaccine: Marland Kitchen Difficulty breathing  . Swelling of face and throat  . A fast heartbeat  . A bad rash all over body  . Dizziness and weakness   Immunizations Administered    Name Date Dose VIS Date Route   Pfizer COVID-19 Vaccine 09/30/2019 10:10 AM 0.3 mL 06/16/2019 Intramuscular   Manufacturer: [employer], [employer]   Lot: [id number]   NDC: [address]-6

## 2020-03-22 ENCOUNTER — Other Ambulatory Visit: Payer: Self-pay | Admitting: Obstetrics and Gynecology

## 2020-03-22 DIAGNOSIS — N6310 Unspecified lump in the right breast, unspecified quadrant: Secondary | ICD-10-CM

## 2020-04-05 ENCOUNTER — Other Ambulatory Visit: Payer: BC Managed Care – PPO

## 2020-04-17 ENCOUNTER — Other Ambulatory Visit: Payer: Self-pay

## 2020-04-17 ENCOUNTER — Ambulatory Visit
Admission: RE | Admit: 2020-04-17 | Discharge: 2020-04-17 | Disposition: A | Discharge status: Home / Self Care | Payer: BC Managed Care – PPO | Source: Ambulatory Visit | Attending: Obstetrics and Gynecology | Admitting: Obstetrics and Gynecology

## 2020-04-17 ENCOUNTER — Other Ambulatory Visit: Payer: BC Managed Care – PPO

## 2020-04-17 DIAGNOSIS — N6310 Unspecified lump in the right breast, unspecified quadrant: Secondary | ICD-10-CM

## 2020-06-26 ENCOUNTER — Other Ambulatory Visit: Payer: Self-pay | Admitting: Obstetrics and Gynecology

## 2020-06-26 DIAGNOSIS — Z0000 Encounter for general adult medical examination without abnormal findings: Secondary | ICD-10-CM

## 2020-08-07 ENCOUNTER — Ambulatory Visit
Admission: RE | Admit: 2020-08-07 | Discharge: 2020-08-07 | Disposition: A | Discharge status: Home / Self Care | Payer: BC Managed Care – PPO | Source: Ambulatory Visit | Attending: Obstetrics and Gynecology | Admitting: Obstetrics and Gynecology

## 2020-08-07 ENCOUNTER — Other Ambulatory Visit: Payer: Self-pay

## 2020-08-07 DIAGNOSIS — Z0000 Encounter for general adult medical examination without abnormal findings: Secondary | ICD-10-CM

## 2020-08-08 ENCOUNTER — Other Ambulatory Visit: Payer: Self-pay | Admitting: Obstetrics and Gynecology

## 2020-08-08 DIAGNOSIS — R928 Other abnormal and inconclusive findings on diagnostic imaging of breast: Secondary | ICD-10-CM

## 2020-08-23 ENCOUNTER — Other Ambulatory Visit: Payer: Self-pay

## 2020-08-23 ENCOUNTER — Ambulatory Visit
Admission: RE | Admit: 2020-08-23 | Discharge: 2020-08-23 | Disposition: A | Discharge status: Home / Self Care | Payer: BC Managed Care – PPO | Source: Ambulatory Visit | Attending: Obstetrics and Gynecology | Admitting: Obstetrics and Gynecology

## 2020-08-23 DIAGNOSIS — R928 Other abnormal and inconclusive findings on diagnostic imaging of breast: Secondary | ICD-10-CM

## 2022-01-08 ENCOUNTER — Other Ambulatory Visit: Payer: Self-pay | Admitting: Obstetrics and Gynecology

## 2022-01-08 DIAGNOSIS — Z1231 Encounter for screening mammogram for malignant neoplasm of breast: Secondary | ICD-10-CM

## 2022-01-09 ENCOUNTER — Ambulatory Visit
Admission: RE | Admit: 2022-01-09 | Discharge: 2022-01-09 | Disposition: A | Discharge status: Home / Self Care | Payer: BC Managed Care – PPO | Source: Ambulatory Visit | Attending: Obstetrics and Gynecology | Admitting: Obstetrics and Gynecology

## 2022-01-09 DIAGNOSIS — Z1231 Encounter for screening mammogram for malignant neoplasm of breast: Secondary | ICD-10-CM

## 2022-01-12 ENCOUNTER — Other Ambulatory Visit: Payer: Self-pay | Admitting: Obstetrics and Gynecology

## 2022-01-12 DIAGNOSIS — R928 Other abnormal and inconclusive findings on diagnostic imaging of breast: Secondary | ICD-10-CM

## 2022-02-02 ENCOUNTER — Ambulatory Visit
Admission: RE | Admit: 2022-02-02 | Discharge: 2022-02-02 | Disposition: A | Discharge status: Home / Self Care | Payer: BC Managed Care – PPO | Source: Ambulatory Visit | Attending: Obstetrics and Gynecology | Admitting: Obstetrics and Gynecology

## 2022-02-02 DIAGNOSIS — R928 Other abnormal and inconclusive findings on diagnostic imaging of breast: Secondary | ICD-10-CM

## 2022-07-18 ENCOUNTER — Emergency Department (HOSPITAL_BASED_OUTPATIENT_CLINIC_OR_DEPARTMENT_OTHER): Payer: BC Managed Care – PPO | Admitting: Radiology

## 2022-07-18 ENCOUNTER — Other Ambulatory Visit: Payer: Self-pay

## 2022-07-18 ENCOUNTER — Emergency Department (HOSPITAL_BASED_OUTPATIENT_CLINIC_OR_DEPARTMENT_OTHER)
Admission: EM | Admit: 2022-07-18 | Discharge: 2022-07-19 | Disposition: A | Discharge status: Home / Self Care | Payer: BC Managed Care – PPO | Attending: Emergency Medicine | Admitting: Emergency Medicine

## 2022-07-18 ENCOUNTER — Encounter (HOSPITAL_BASED_OUTPATIENT_CLINIC_OR_DEPARTMENT_OTHER): Payer: Self-pay

## 2022-07-18 DIAGNOSIS — R0789 Other chest pain: Secondary | ICD-10-CM

## 2022-07-18 DIAGNOSIS — I10 Essential (primary) hypertension: Secondary | ICD-10-CM | POA: Diagnosis not present

## 2022-07-18 HISTORY — DX: Essential (primary) hypertension: I10

## 2022-07-18 LAB — BASIC METABOLIC PANEL
Anion gap: 10 (ref 5–15)
BUN: 14 mg/dL (ref 6–20)
CO2: 27 mmol/L (ref 22–32)
Calcium: 9.5 mg/dL (ref 8.9–10.3)
Chloride: 100 mmol/L (ref 98–111)
Creatinine, Ser: 0.82 mg/dL (ref 0.44–1.00)
GFR, Estimated: >60 mL/min (ref >60)
Glucose, Bld: 100 mg/dL — ABNORMAL HIGH (ref 70–99)
Potassium: 3.7 mmol/L (ref 3.5–5.1)
Sodium: 137 mmol/L (ref 135–145)

## 2022-07-18 LAB — CBC
HCT: 41.5 % (ref 36.0–46.0)
Hemoglobin: 14.4 g/dL (ref 12.0–15.0)
MCH: 32.3 pg (ref 26.0–34.0)
MCHC: 34.7 g/dL (ref 30.0–36.0)
MCV: 93 fL (ref 80.0–100.0)
Platelets: 222 10*3/uL (ref 150–400)
RBC: 4.46 MIL/uL (ref 3.87–5.11)
RDW: 11.7 % (ref 11.5–15.5)
WBC: 5 10*3/uL (ref 4.0–10.5)
nRBC: 0 % (ref 0.0–0.2)

## 2022-07-18 LAB — TROPONIN I (HIGH SENSITIVITY): Troponin I (High Sensitivity): <2 ng/L (ref <18)

## 2022-07-18 NOTE — ED Triage Notes (Signed)
Patient here POV from Home.  Endorses Mid Chest Pain that began today. Associated with SOB.  No Cough. No N/V.  NAD Noted during Triage. A&Ox4. GCS 15. Ambulatory.

## 2022-07-19 MED ORDER — IBUPROFEN 400 MG PO TABS
600.0000 mg | ORAL_TABLET | Freq: Once | ORAL | Status: AC
  Administered 2022-07-19: 600 mg via ORAL
  Filled 2022-07-19: qty 1

## 2022-07-19 NOTE — Discharge Instructions (Addendum)
Please take Motrin 600mg  every 8 hours as needed for chest discomfort.

## 2022-07-19 NOTE — ED Notes (Signed)
Reviewed AVS/discharge instruction with patient. Time allotted for and all questions answered. Patient is agreeable for d/c and escorted to ed exit by staff.  

## 2022-07-19 NOTE — ED Provider Notes (Signed)
[facility]  Provider Note  CSN: [id number] Arrival date & time: 07/18/22 1831  History Chief Complaint  Patient presents with   Chest Pain    Lisa Jordan is a 52 y.o. female with history of HTN, but no DM, HLD or tobacco use reports 3 days ago she was feeling ill at work and had her vitals checked. Reports her BP and HR were elevated. She began having some chest pressure later that evening that has been persistent since then. She denies SOB but reports difficulty taking a deep breath due to the chest discomfort. She has continued to have mildly elevated BP readings at home. No cough or fever. Chest pain does not radiate, no N/V or diaphoresis. No known CAD.    Home Medications Prior to Admission medications   Not on File     Allergies    Codeine   Review of Systems   Review of Systems Please see HPI for pertinent positives and negatives  Physical Exam BP (!) 143/96   Pulse 83   Temp (!) 97.5 F (36.4 C)   Resp 20   Ht 5\' 4"  (1.626 m)   Wt 74.8 kg   LMP  (LMP Unknown)   SpO2 100%   BMI 28.32 kg/m   Physical Exam Vitals and nursing note reviewed.  Constitutional:      Appearance: Normal appearance.  HENT:     Head: Normocephalic and atraumatic.     Nose: Nose normal.     Mouth/Throat:     Mouth: Mucous membranes are moist.  Eyes:     Extraocular Movements: Extraocular movements intact.     Conjunctiva/sclera: Conjunctivae normal.  Cardiovascular:     Rate and Rhythm: Normal rate.  Pulmonary:     Effort: Pulmonary effort is normal.     Breath sounds: Normal breath sounds.  Chest:     Chest wall: Tenderness present.  Abdominal:     General: Abdomen is flat.     Palpations: Abdomen is soft.     Tenderness: There is no abdominal tenderness.  Musculoskeletal:        General: No swelling. Normal range of motion.     Cervical back: Neck supple.  Skin:    General: Skin is warm and dry.  Neurological:     General: No focal  deficit present.     Mental Status: She is alert.  Psychiatric:        Mood and Affect: Mood normal.     ED Results / Procedures / Treatments   EKG EKG Interpretation  Date/Time:  Saturday July 18 2022 18:38:00 EST Ventricular Rate:  79 PR Interval:  168 QRS Duration: 74 QT Interval:  368 QTC Calculation: 421 R Axis:   23 Text Interpretation: Sinus rhythm with marked sinus arrhythmia Cannot rule out Anterior infarct , age undetermined Abnormal ECG When compared with ECG of 02-Dec-2007 14:36, No significant change was found Confirmed by Calvert Cantor [phone]) on 07/19/2022 12:39:23 AM  Procedures Procedures  Medications Ordered in the ED Medications  ibuprofen (ADVIL) tablet 600 mg (has no administration in time range)    Initial Impression and Plan  Patient with low risk factor profile here with atypical and reproducible CP for 2 days. Had normal labs including CBC, BMP and Trop in triage. Given duration of symptoms, repeat Trop is not necessary. I personally viewed the images from radiology studies and agree with radiologist interpretation: CXR is clear. HEART Score is 2 (Low risk). Recommend she  continue to monitor BP at home, follow up with PCP next week for a recheck. Advised to use Motrin for chest discomfort. RTED for any other concerns.   ED Course       MDM Rules/Calculators/A&P Medical Decision Making Given presenting complaint, I considered that admission might be necessary. After review of results from ED lab and/or imaging studies, admission to the hospital is not indicated at this time.    Problems Addressed: Atypical chest pain: acute illness or injury Uncontrolled hypertension: acute illness or injury  Amount and/or Complexity of Data Reviewed Labs: ordered. Decision-making details documented in ED Course. Radiology: ordered and independent interpretation performed. Decision-making details documented in ED Course. ECG/medicine tests: ordered and  independent interpretation performed. Decision-making details documented in ED Course.  Risk OTC drugs. Decision regarding hospitalization.    Final Clinical Impression(s) / ED Diagnoses Final diagnoses:  Atypical chest pain  Uncontrolled hypertension    Rx / DC Orders ED Discharge Orders     None        Truddie Hidden, MD 07/19/22 [phone]

## 2022-11-06 ENCOUNTER — Other Ambulatory Visit: Payer: Self-pay | Admitting: Nurse Practitioner

## 2022-11-06 DIAGNOSIS — N6320 Unspecified lump in the left breast, unspecified quadrant: Secondary | ICD-10-CM

## 2022-12-15 ENCOUNTER — Other Ambulatory Visit: Payer: BC Managed Care – PPO

## 2023-02-04 ENCOUNTER — Other Ambulatory Visit: Payer: Self-pay | Admitting: Nurse Practitioner

## 2023-02-04 ENCOUNTER — Ambulatory Visit
Admission: RE | Admit: 2023-02-04 | Discharge: 2023-02-04 | Disposition: A | Discharge status: Home / Self Care | Payer: BC Managed Care – PPO | Source: Ambulatory Visit | Attending: Nurse Practitioner | Admitting: Nurse Practitioner

## 2023-02-04 DIAGNOSIS — N6320 Unspecified lump in the left breast, unspecified quadrant: Secondary | ICD-10-CM

## 2024-01-19 ENCOUNTER — Other Ambulatory Visit: Payer: Self-pay | Admitting: Family Medicine

## 2024-01-19 DIAGNOSIS — Z1231 Encounter for screening mammogram for malignant neoplasm of breast: Secondary | ICD-10-CM

## 2024-02-08 ENCOUNTER — Ambulatory Visit: Payer: Self-pay

## 2024-02-15 ENCOUNTER — Ambulatory Visit
Admission: RE | Admit: 2024-02-15 | Discharge: 2024-02-15 | Disposition: A | Discharge status: Home / Self Care | Payer: Self-pay | Source: Ambulatory Visit | Attending: Family Medicine | Admitting: Family Medicine

## 2024-02-15 DIAGNOSIS — Z1231 Encounter for screening mammogram for malignant neoplasm of breast: Secondary | ICD-10-CM
